# Patient Record
Sex: Female | Born: 1960 | Race: White | Hispanic: No | Marital: Married
Health system: Southern US, Community
[De-identification: ages and names within clinical notes are randomized; demographics above are authoritative.]

---

## 2010-02-12 ENCOUNTER — Ambulatory Visit: Payer: Self-pay | Admitting: Internal Medicine

## 2010-02-25 ENCOUNTER — Ambulatory Visit: Payer: Self-pay | Admitting: Family Medicine

## 2010-03-04 ENCOUNTER — Ambulatory Visit: Payer: Self-pay | Admitting: Internal Medicine

## 2010-03-05 ENCOUNTER — Ambulatory Visit: Payer: Self-pay | Admitting: Internal Medicine

## 2010-03-05 LAB — CANCER ANTIGEN 27.29: CA 27.29: 24.8 U/mL

## 2010-03-15 ENCOUNTER — Ambulatory Visit: Payer: Self-pay | Admitting: Internal Medicine

## 2010-03-22 ENCOUNTER — Ambulatory Visit: Payer: Self-pay | Admitting: Surgery

## 2010-04-15 ENCOUNTER — Ambulatory Visit: Payer: Self-pay | Admitting: Internal Medicine

## 2010-05-15 ENCOUNTER — Ambulatory Visit: Payer: Self-pay | Admitting: Internal Medicine

## 2010-05-17 ENCOUNTER — Ambulatory Visit: Payer: Self-pay | Admitting: Surgery

## 2010-05-24 ENCOUNTER — Ambulatory Visit: Payer: Self-pay | Admitting: Surgery

## 2010-05-31 LAB — PATHOLOGY REPORT

## 2010-06-15 ENCOUNTER — Ambulatory Visit: Payer: Self-pay | Admitting: Internal Medicine

## 2010-07-15 ENCOUNTER — Ambulatory Visit: Payer: Self-pay | Admitting: Internal Medicine

## 2010-08-15 ENCOUNTER — Ambulatory Visit: Payer: Self-pay | Admitting: Internal Medicine

## 2010-09-15 ENCOUNTER — Ambulatory Visit: Payer: Self-pay | Admitting: Internal Medicine

## 2010-10-14 ENCOUNTER — Ambulatory Visit: Payer: Self-pay | Admitting: Internal Medicine

## 2010-11-14 ENCOUNTER — Ambulatory Visit: Payer: Self-pay | Admitting: Internal Medicine

## 2010-12-14 ENCOUNTER — Ambulatory Visit: Payer: Self-pay | Admitting: Internal Medicine

## 2011-01-14 ENCOUNTER — Ambulatory Visit: Payer: Self-pay | Admitting: Internal Medicine

## 2011-02-13 ENCOUNTER — Ambulatory Visit: Payer: Self-pay | Admitting: Internal Medicine

## 2011-03-16 ENCOUNTER — Ambulatory Visit: Payer: Self-pay | Admitting: Internal Medicine

## 2011-04-16 ENCOUNTER — Ambulatory Visit: Payer: Self-pay | Admitting: Internal Medicine

## 2011-05-16 ENCOUNTER — Ambulatory Visit: Payer: Self-pay | Admitting: Internal Medicine

## 2011-06-16 ENCOUNTER — Ambulatory Visit: Payer: Self-pay | Admitting: Internal Medicine

## 2011-07-16 ENCOUNTER — Ambulatory Visit: Payer: Self-pay | Admitting: Internal Medicine

## 2011-07-19 ENCOUNTER — Ambulatory Visit: Payer: Self-pay | Admitting: Surgery

## 2011-08-16 ENCOUNTER — Ambulatory Visit: Payer: Self-pay | Admitting: Internal Medicine

## 2011-09-13 ENCOUNTER — Ambulatory Visit: Payer: Self-pay

## 2011-09-13 LAB — CBC
HCT: 38.9 % (ref 35.0–47.0)
HGB: 13.3 g/dL (ref 12.0–16.0)
MCH: 31.7 pg (ref 26.0–34.0)
MCHC: 34.1 g/dL (ref 32.0–36.0)
MCV: 93 fL (ref 80–100)
Platelet: 211 10*3/uL (ref 150–440)
RDW: 12.7 % (ref 11.5–14.5)
WBC: 4.8 10*3/uL (ref 3.6–11.0)

## 2011-09-13 LAB — BASIC METABOLIC PANEL
Anion Gap: 8 (ref 7–16)
Calcium, Total: 8.8 mg/dL (ref 8.5–10.1)
Chloride: 108 mmol/L — ABNORMAL HIGH (ref 98–107)
Co2: 28 mmol/L (ref 21–32)
EGFR (Non-African Amer.): >60
Osmolality: 286 (ref 275–301)

## 2011-09-16 ENCOUNTER — Ambulatory Visit: Payer: Self-pay | Admitting: Internal Medicine

## 2011-10-14 ENCOUNTER — Ambulatory Visit: Payer: Self-pay | Admitting: Internal Medicine

## 2011-11-03 LAB — CBC CANCER CENTER
Basophil %: 0.9 %
Eosinophil #: 0 x10 3/mm (ref 0.0–0.7)
HCT: 40.5 % (ref 35.0–47.0)
HGB: 13.6 g/dL (ref 12.0–16.0)
Lymphocyte #: 1.1 x10 3/mm (ref 1.0–3.6)
Lymphocyte %: 29.5 %
MCHC: 33.6 g/dL (ref 32.0–36.0)
MCV: 93.9 fL (ref 80–100)
Neutrophil #: 2.4 x10 3/mm (ref 1.4–6.5)
RBC: 4.31 10*6/uL (ref 3.80–5.20)
RDW: 12.1 % (ref 11.5–14.5)

## 2011-11-03 LAB — BASIC METABOLIC PANEL
BUN: 15 mg/dL (ref 7–18)
Chloride: 104 mmol/L (ref 98–107)
Co2: 30 mmol/L (ref 21–32)
Creatinine: 0.79 mg/dL (ref 0.60–1.30)
EGFR (Non-African Amer.): >60
Glucose: 91 mg/dL (ref 65–99)
Osmolality: 284 (ref 275–301)
Potassium: 4 mmol/L (ref 3.5–5.1)
Sodium: 142 mmol/L (ref 136–145)

## 2011-11-03 LAB — HEPATIC FUNCTION PANEL A (ARMC)
Albumin: 3.8 g/dL (ref 3.4–5.0)
Alkaline Phosphatase: 84 U/L (ref 50–136)
SGOT(AST): 23 U/L (ref 15–37)
SGPT (ALT): 28 U/L
Total Protein: 7.1 g/dL (ref 6.4–8.2)

## 2011-11-14 ENCOUNTER — Ambulatory Visit: Payer: Self-pay | Admitting: Internal Medicine

## 2011-11-15 ENCOUNTER — Inpatient Hospital Stay: Payer: Self-pay | Admitting: Surgery

## 2011-11-16 LAB — BASIC METABOLIC PANEL
Calcium, Total: 8.2 mg/dL — ABNORMAL LOW (ref 8.5–10.1)
Chloride: 110 mmol/L — ABNORMAL HIGH (ref 98–107)
Co2: 27 mmol/L (ref 21–32)
EGFR (Non-African Amer.): >60
Osmolality: 289 (ref 275–301)
Potassium: 3.7 mmol/L (ref 3.5–5.1)
Sodium: 146 mmol/L — ABNORMAL HIGH (ref 136–145)

## 2011-12-14 ENCOUNTER — Ambulatory Visit: Payer: Self-pay | Admitting: Internal Medicine

## 2012-01-14 ENCOUNTER — Ambulatory Visit: Payer: Self-pay | Admitting: Internal Medicine

## 2012-01-19 ENCOUNTER — Ambulatory Visit: Payer: Self-pay

## 2012-02-13 ENCOUNTER — Ambulatory Visit: Payer: Self-pay | Admitting: Internal Medicine

## 2012-02-23 LAB — CBC CANCER CENTER
Basophil %: 0.6 %
Eosinophil %: 2.3 %
Eosinophil: 3 %
HCT: 37.6 % (ref 35.0–47.0)
HGB: 12.9 g/dL (ref 12.0–16.0)
Lymphocyte #: 1.4 x10 3/mm (ref 1.0–3.6)
Lymphocyte %: 45.1 %
MCH: 32 pg (ref 26.0–34.0)
MCV: 93 fL (ref 80–100)
Monocyte #: 0.4 x10 3/mm (ref 0.2–0.9)
Monocyte %: 12 %
Platelet: 99 x10 3/mm — ABNORMAL LOW (ref 150–440)
RBC: 4.05 10*6/uL (ref 3.80–5.20)
Segmented Neutrophils: 37 %
WBC: 3 x10 3/mm — ABNORMAL LOW (ref 3.6–11.0)

## 2012-02-23 LAB — BASIC METABOLIC PANEL
Anion Gap: 8 (ref 7–16)
BUN: 10 mg/dL (ref 7–18)
Calcium, Total: 9.1 mg/dL (ref 8.5–10.1)
Chloride: 104 mmol/L (ref 98–107)
Co2: 29 mmol/L (ref 21–32)
Creatinine: 0.78 mg/dL (ref 0.60–1.30)
Osmolality: 280 (ref 275–301)
Sodium: 141 mmol/L (ref 136–145)

## 2012-02-23 LAB — FIBRINOGEN: Fibrinogen: 377 mg/dL (ref 210–470)

## 2012-02-23 LAB — IRON AND TIBC
Iron Bind.Cap.(Total): 340 ug/dL (ref 250–450)
Iron: 167 ug/dL (ref 50–170)
Unbound Iron-Bind.Cap.: 173 ug/dL

## 2012-02-23 LAB — HEPATIC FUNCTION PANEL A (ARMC)
Alkaline Phosphatase: 78 U/L (ref 50–136)
Bilirubin, Direct: 0.1 mg/dL (ref 0.00–0.20)
Bilirubin,Total: 0.5 mg/dL (ref 0.2–1.0)
SGOT(AST): 49 U/L — ABNORMAL HIGH (ref 15–37)

## 2012-02-23 LAB — PROTIME-INR: Prothrombin Time: 12.4 secs (ref 11.5–14.7)

## 2012-02-23 LAB — RETICULOCYTES
Absolute Retic Count: 0.0416 10*6/uL (ref 0.024–0.084)
Reticulocyte: 1.02 % (ref 0.5–1.5)

## 2012-02-23 LAB — SEDIMENTATION RATE: Erythrocyte Sed Rate: 14 mm/hr (ref 0–30)

## 2012-02-24 LAB — URINE IEP, RANDOM

## 2012-02-27 LAB — PROT IMMUNOELECTROPHORES(ARMC)

## 2012-03-08 LAB — CBC CANCER CENTER
Basophil #: 0 x10 3/mm (ref 0.0–0.1)
Basophil %: 1 %
Eosinophil #: 0 x10 3/mm (ref 0.0–0.7)
Lymphocyte %: 50.1 %
MCH: 31.5 pg (ref 26.0–34.0)
MCHC: 33.8 g/dL (ref 32.0–36.0)
MCV: 93 fL (ref 80–100)
Monocyte #: 0.3 x10 3/mm (ref 0.2–0.9)
Monocyte %: 18.1 %
Neutrophil #: 0.5 x10 3/mm — ABNORMAL LOW (ref 1.4–6.5)
Neutrophil %: 29.9 %
RDW: 13.4 % (ref 11.5–14.5)
WBC: 1.8 x10 3/mm — CL (ref 3.6–11.0)

## 2012-03-13 LAB — CBC CANCER CENTER
Eosinophil: 2 %
HGB: 11.7 g/dL — ABNORMAL LOW (ref 12.0–16.0)
Lymphocytes: 56 %
MCHC: 34.2 g/dL (ref 32.0–36.0)
MCV: 94 fL (ref 80–100)
Other Cells Blood: 2 %
Platelet: 95 x10 3/mm — ABNORMAL LOW (ref 150–440)
RDW: 13.8 % (ref 11.5–14.5)
Segmented Neutrophils: 23 %
WBC: 1.9 x10 3/mm — CL (ref 3.6–11.0)

## 2012-03-13 LAB — RETICULOCYTES
Absolute Retic Count: 0.0483 10*6/uL (ref 0.023–0.096)
Reticulocyte: 1.33 % (ref 0.5–2.2)

## 2012-03-15 ENCOUNTER — Ambulatory Visit: Payer: Self-pay | Admitting: Internal Medicine

## 2012-04-15 ENCOUNTER — Ambulatory Visit: Payer: Self-pay | Admitting: Internal Medicine

## 2012-05-11 LAB — CBC CANCER CENTER
Basophil %: 1.4 %
Eosinophil #: 0 x10 3/mm (ref 0.0–0.7)
Eosinophil %: 4.3 %
HGB: 10 g/dL — ABNORMAL LOW (ref 12.0–16.0)
Lymphocyte #: 0.6 x10 3/mm — ABNORMAL LOW (ref 1.0–3.6)
Lymphocyte %: 67.4 %
MCH: 32.3 pg (ref 26.0–34.0)
MCHC: 34.8 g/dL (ref 32.0–36.0)
Monocyte #: 0 x10 3/mm — ABNORMAL LOW (ref 0.2–0.9)
Neutrophil %: 25.7 %
RBC: 3.1 10*6/uL — ABNORMAL LOW (ref 3.80–5.20)
RDW: 17.9 % — ABNORMAL HIGH (ref 11.5–14.5)

## 2012-05-14 LAB — CBC CANCER CENTER
Basophil %: 0.1 %
Eosinophil %: 2.2 %
HGB: 9.5 g/dL — ABNORMAL LOW (ref 12.0–16.0)
Lymphocyte #: 0.8 x10 3/mm — ABNORMAL LOW (ref 1.0–3.6)
Lymphocyte %: 70.8 %
MCH: 32.5 pg (ref 26.0–34.0)
Monocyte #: 0.3 x10 3/mm (ref 0.2–0.9)
Neutrophil %: 3.9 %
Platelet: 12 x10 3/mm — CL (ref 150–440)
RBC: 2.93 10*6/uL — ABNORMAL LOW (ref 3.80–5.20)
WBC: 1.1 x10 3/mm — CL (ref 3.6–11.0)

## 2012-05-15 ENCOUNTER — Ambulatory Visit: Payer: Self-pay | Admitting: Internal Medicine

## 2012-05-16 LAB — CBC CANCER CENTER
Basophil %: 0.4 %
Eosinophil %: 0.5 %
HGB: 8.8 g/dL — ABNORMAL LOW (ref 12.0–16.0)
Lymphocyte #: 2.5 x10 3/mm (ref 1.0–3.6)
MCH: 31.7 pg (ref 26.0–34.0)
MCV: 94 fL (ref 80–100)
Monocyte #: 6.2 x10 3/mm — ABNORMAL HIGH (ref 0.2–0.9)
RBC: 2.76 10*6/uL — ABNORMAL LOW (ref 3.80–5.20)

## 2012-05-18 LAB — CBC CANCER CENTER
Comment - H1-Com2: NORMAL
HGB: 8.7 g/dL — ABNORMAL LOW (ref 12.0–16.0)
MCH: 31.8 pg (ref 26.0–34.0)
MCV: 93 fL (ref 80–100)
Metamyelocyte: 6 %
Monocytes: 15 %
Platelet: 49 x10 3/mm — ABNORMAL LOW (ref 150–440)
RBC: 2.74 10*6/uL — ABNORMAL LOW (ref 3.80–5.20)
WBC: 30.7 x10 3/mm — ABNORMAL HIGH (ref 3.6–11.0)

## 2012-05-21 LAB — CBC CANCER CENTER
Basophil #: 0.1 x10 3/mm (ref 0.0–0.1)
Basophil %: 0.7 %
Eosinophil %: 0.5 %
HCT: 25.6 % — ABNORMAL LOW (ref 35.0–47.0)
HGB: 8.8 g/dL — ABNORMAL LOW (ref 12.0–16.0)
Lymphocyte #: 1.8 x10 3/mm (ref 1.0–3.6)
Lymphocyte %: 14.8 %
MCH: 32.2 pg (ref 26.0–34.0)
MCV: 94 fL (ref 80–100)
Monocyte %: 13.1 %
Neutrophil #: 8.6 x10 3/mm — ABNORMAL HIGH (ref 1.4–6.5)
Platelet: 139 x10 3/mm — ABNORMAL LOW (ref 150–440)
RDW: 17.4 % — ABNORMAL HIGH (ref 11.5–14.5)
WBC: 12.1 x10 3/mm — ABNORMAL HIGH (ref 3.6–11.0)

## 2012-05-23 LAB — CBC CANCER CENTER
Basophil %: 0.6 %
Eosinophil #: 0 x10 3/mm (ref 0.0–0.7)
Eosinophil %: 0.8 %
HCT: 25.7 % — ABNORMAL LOW (ref 35.0–47.0)
HGB: 8.8 g/dL — ABNORMAL LOW (ref 12.0–16.0)
Lymphocyte #: 1.1 x10 3/mm (ref 1.0–3.6)
MCHC: 34.4 g/dL (ref 32.0–36.0)
MCV: 95 fL (ref 80–100)
Monocyte #: 0.9 x10 3/mm (ref 0.2–0.9)
Monocyte %: 18.7 %
Platelet: 210 x10 3/mm (ref 150–440)
RBC: 2.71 10*6/uL — ABNORMAL LOW (ref 3.80–5.20)
WBC: 4.6 x10 3/mm (ref 3.6–11.0)

## 2012-05-30 LAB — CBC CANCER CENTER
Basophil #: 0 x10 3/mm (ref 0.0–0.1)
Eosinophil #: 0 x10 3/mm (ref 0.0–0.7)
HGB: 9.4 g/dL — ABNORMAL LOW (ref 12.0–16.0)
Lymphocyte #: 0.6 x10 3/mm — ABNORMAL LOW (ref 1.0–3.6)
Lymphocyte %: 29.9 %
MCH: 34.4 pg — ABNORMAL HIGH (ref 26.0–34.0)
MCHC: 34.7 g/dL (ref 32.0–36.0)
Monocyte #: 0.4 x10 3/mm (ref 0.2–0.9)
Monocyte %: 18.1 %
Neutrophil %: 50.6 %
Platelet: 170 x10 3/mm (ref 150–440)
RDW: 22.8 % — ABNORMAL HIGH (ref 11.5–14.5)
WBC: 2 x10 3/mm — CL (ref 3.6–11.0)

## 2012-05-30 LAB — HEPATIC FUNCTION PANEL A (ARMC)
Albumin: 3.4 g/dL (ref 3.4–5.0)
SGOT(AST): 20 U/L (ref 15–37)
SGPT (ALT): 29 U/L (ref 12–78)

## 2012-05-30 LAB — CREATININE, SERUM
EGFR (African American): >60
EGFR (Non-African Amer.): >60

## 2012-05-31 LAB — CANCER ANTIGEN 27.29: CA 27.29: 38.9 U/mL — ABNORMAL HIGH (ref 0.0–38.6)

## 2012-06-15 ENCOUNTER — Ambulatory Visit: Payer: Self-pay | Admitting: Internal Medicine

## 2012-08-30 IMAGING — US ABDOMEN ULTRASOUND
1 series · 14 of 25 positions shown · non-contrast
Comparison: none

REASON FOR EXAM: thrombocytopenia eval hepatosplenomegaly
COMMENTS:

PROCEDURE:     TIGER - TIGER ABDOMEN GENERAL SURVEY  - February 28, 2012  [DATE]
RESULT:     Comparison: None.
TECHNIQUE: Multiple grayscale and color Doppler images were obtained of the
abdomen.

[Series 1: abdomen ultrasound · 0.21mm/px · 14 of 67 slices shown]
[im 1/67]
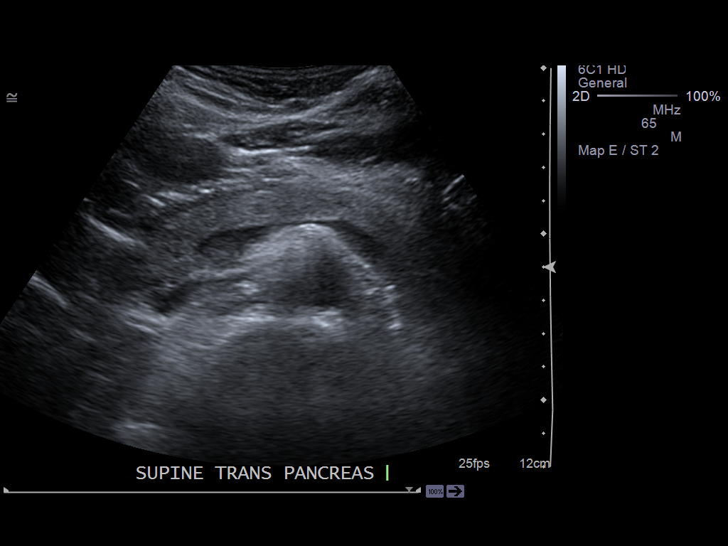
[im 6/67]
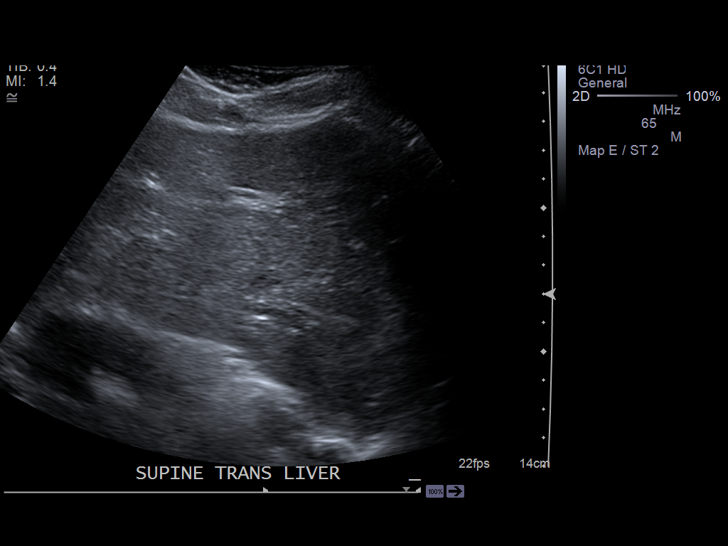
[im 12/67]
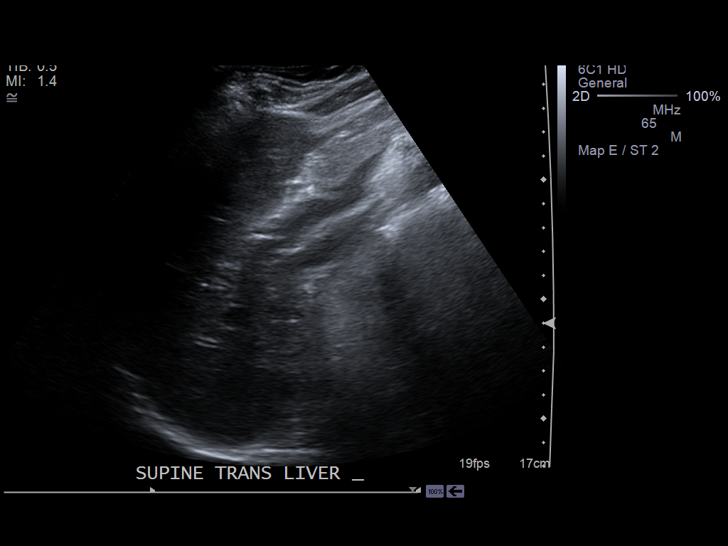
[im 17/67]
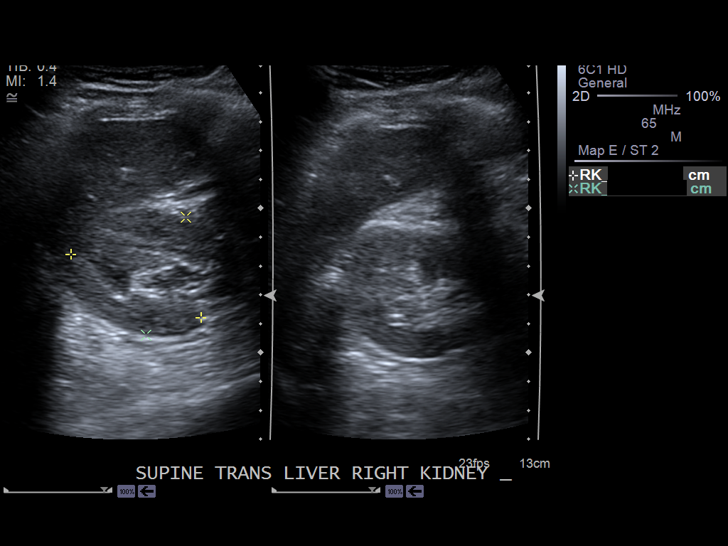
[im 23/67]
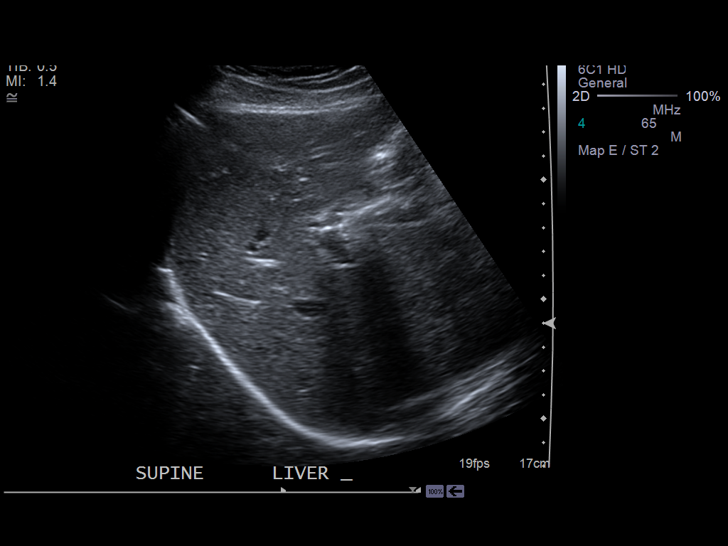
[im 25/67]
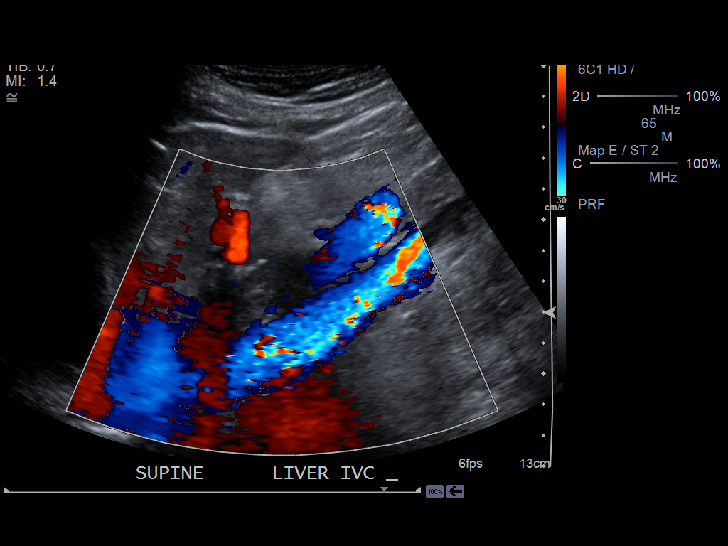
[im 31/67]
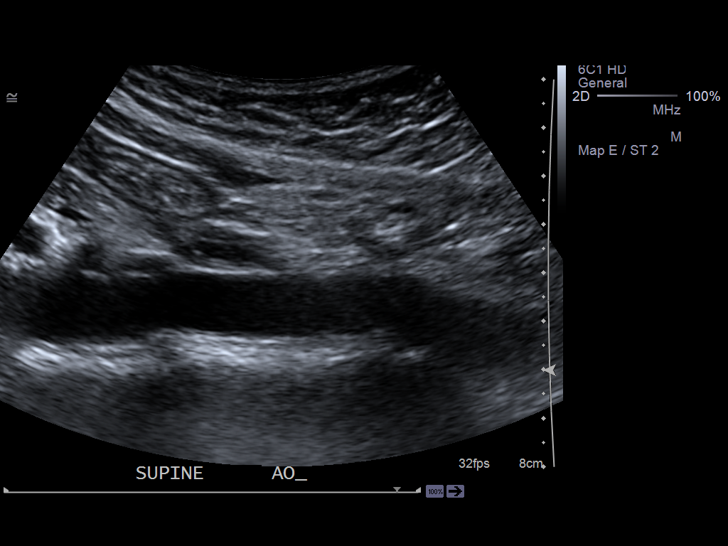
[im 36/67]
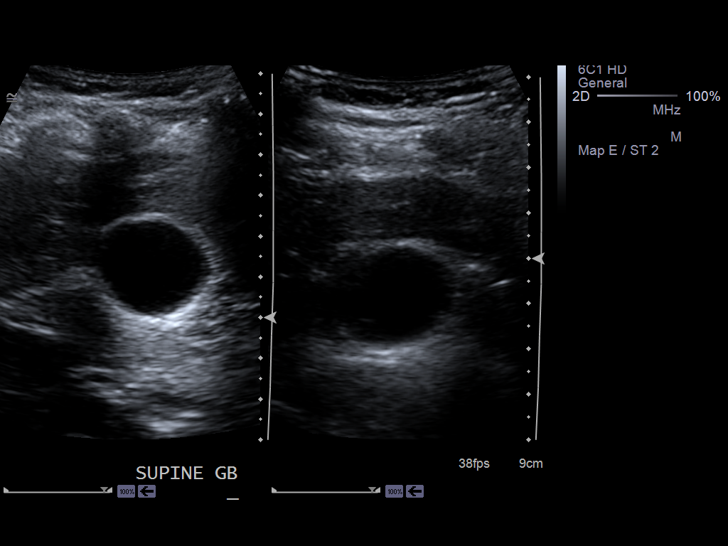
[im 42/67]
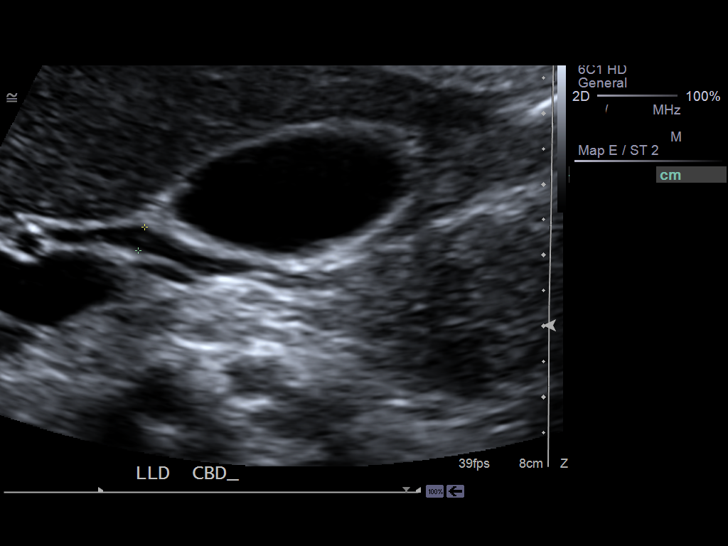
[im 45/67]
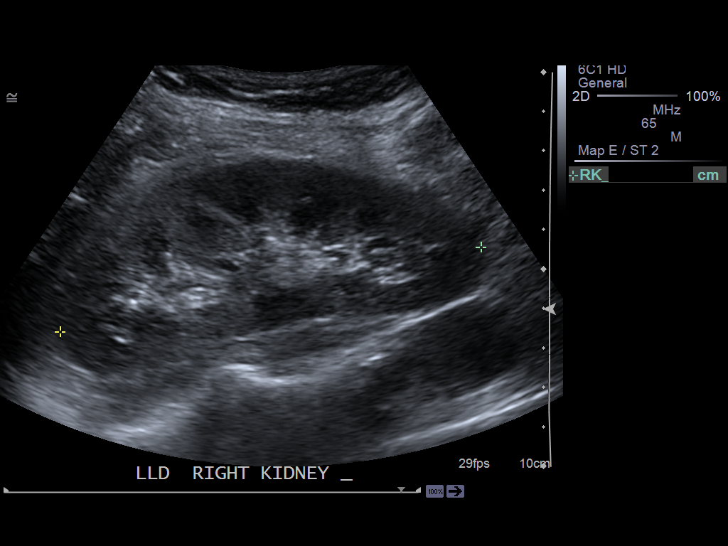
[im 50/67]
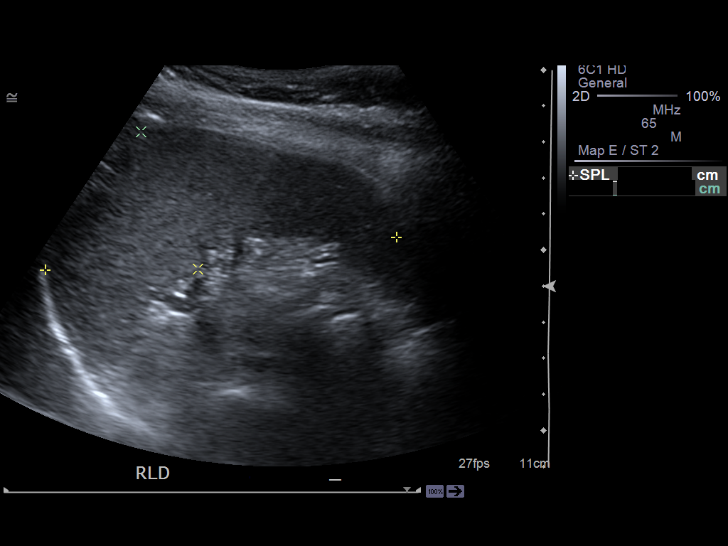
[im 56/67]
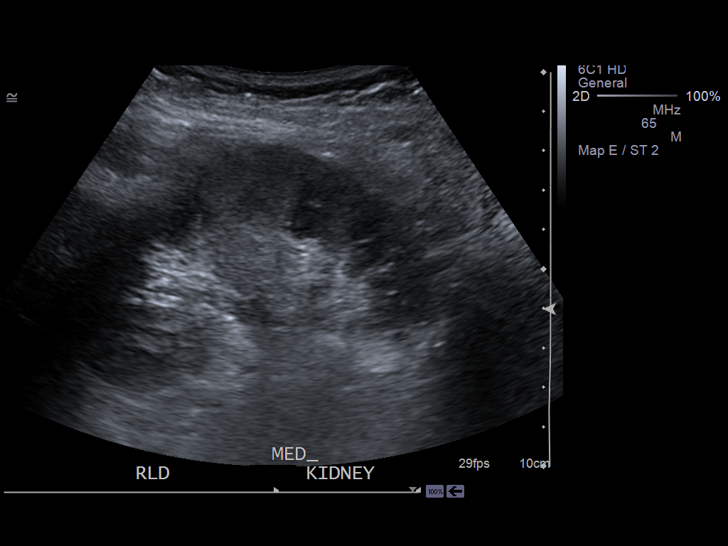
[im 61/67]
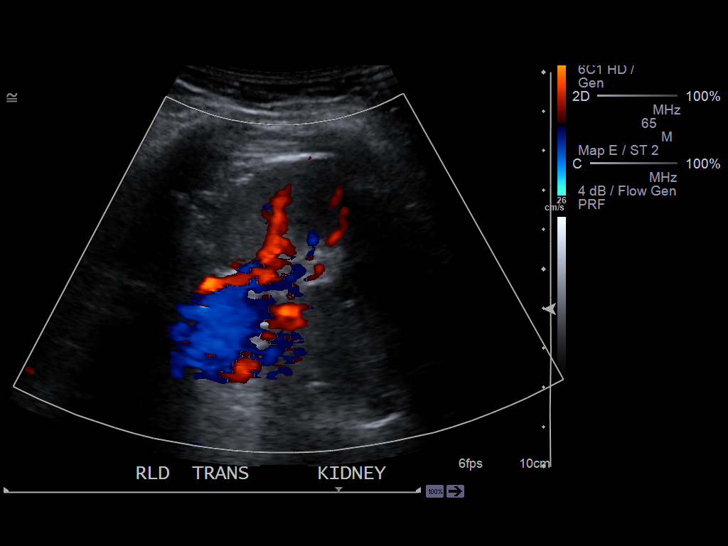
[im 67/67]
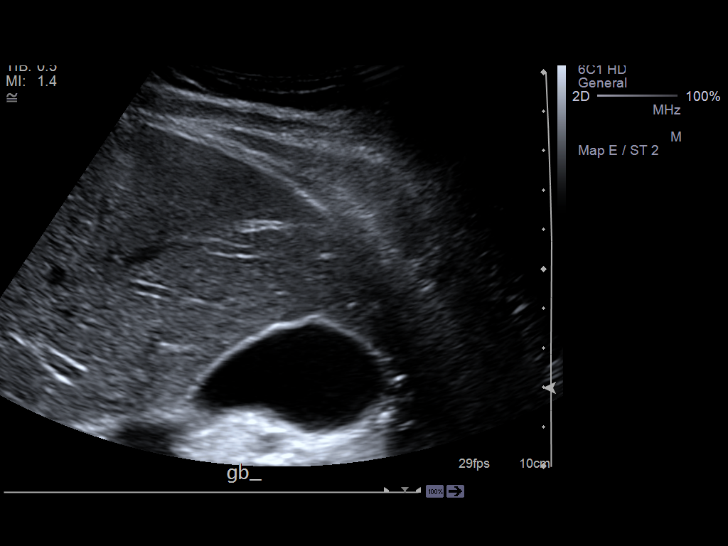

[14 of 25 positions shown; findings below may reference images not displayed]

FINDINGS: The visualized liver, pancreas, and spleen are unremarkable. The spleen
measures 9.9 cm in greatest dimension. The gallbladder is normal.
Sonographic Murphy sign was negative. The common bile duct measures 4 mm.

Images of the kidneys showed no hydronephrosis.
IMPRESSION: Unremarkable abdominal ultrasound. The spleen is normal in size.

[REDACTED]

## 2012-10-30 ENCOUNTER — Ambulatory Visit: Payer: Self-pay | Admitting: Internal Medicine

## 2014-12-07 NOTE — Op Note (Signed)
PATIENT NAME:  Nicole Cooley, Nicole Cooley MR#:  [id number] DATE OF BIRTH:  1961/07/22  DATE OF PROCEDURE:  11/15/2011  PREOPERATIVE DIAGNOSIS: Personal history of breast cancer status post left modified radical mastectomy and radiation therapy to the chest.   POSTOPERATIVE DIAGNOSIS: Personal history of breast cancer status post left modified radical mastectomy and radiation therapy to the chest.   PROCEDURE: Left delayed breast reconstruction with latissimus dorsi musculocutaneous flap extended with subcutaneous fat.   SURGEON:  Caryl Asp, M.D.   ANESTHESIA: General endotracheal.   ESTIMATED BLOOD LOSS: Less than 50 mL.   COMPLICATIONS: None.   DRAINS: 15 [place] Blake drains time two in the back and 15 [place] Blake drain times one in the chest.   FINDINGS: Excellent tissue perfusion and hemostasis at the completion of the procedure with good breast shape.   STATEMENT OF NECESSITY: Nicole Cooley is a very pleasant 54 year old female with a history of left-sided breast cancer. She has undergone modified radical mastectomy and delayed breast reconstruction until this point. She has a small breast on the right contralateral side and is interested in reconstruction with a possible augmentation to both sides. The tissue envelope is quite fibrotic secondary to radiation therapy and as such we have discussed the need for latissimus dorsi flap. The risks, benefits, and alternatives including tissue expansion and TRAM flap reconstruction have been discussed with her thoroughly and she has requested the procedure as described.   STATEMENT OF PROCEDURE: The patient was brought to the operating room awake, alert and comfortable and placed on the OR table in the supine position. Endotracheal anesthesia was introduced without apparent complication. A Foley catheter was inserted and the patient was transferred to the right lateral decubitus position with an axillary roll and beanbag underneath her. Bony prominences were  padded. SCDs were on and functioning and the patient's chest was prepped and draped in the usual sterile fashion. The preoperative markings were utilized. An elliptical skin paddle was marked out posteriorly approximately 14 cm in length and 8 cm in height. This was made sharply through the skin, and then using electrocautery the skin paddle was beveled down through Scarpa's, and then the plane underneath Scarpa's fascia, allowing the subcutaneous excess fat to remain with the latissimus flap, was dissected in all directions. Using a lighted retractor, hemostasis was proactively maintained throughout the dissection. At the limits of the dissection posteriorly the trapezius muscle was elevated and the superior edge of the latissimus was identified. This was disconnected from the midline and the latissimus was swept away from the deeper layers, including the fat overlying the serratus and the underlying lumbar fascia. Intercostal perforators were identified and clipped 2 times on each side and then cauterized and divided. The inferior edge of the flap was also divided under direct visualization and the flap was then rolled up and anteriorly, continuing the dissection over the serratus, being careful not to lift up this musculature. Dissection continued up towards the axilla and at the completion of the procedure the flap was fully mobile. Hemostasis was confirmed. The wound was copiously irrigated and attention was directed towards the chest and the previous incision was opened and flaps developed superiorly and inferiorly directly along the pectoralis musculature. Minimal bleeding was encountered. A tunnel was created close to the axilla in the superior portion of the previous dissection and the latissimus and flap were easily passed through this tunnel. Two 15 [place] Blake drains were brought out through the flank and secured in place and the entire wound  was sprayed with Tisseel and then closed with minimal tension  using deep 0 PDS suture in Scarpa's layer followed by 3-0 Vicryl in the dermis and 4-0 subcuticular stitch. The wound was dressed with Dermabond and Steri-Strips and attention was directed towards the chest. The flap was carefully laid inside the chest pocket and stapled in place and the wound was covered with Ioban. Sterile dressing was applied to the back and then the patient was rolled to the supine position. The bean bag was removed and bony prominences were re-padded. Both arms were placed in the outstretched position and the patient was then reprepped and draped in the usual sterile fashion.   Further mobilization of the flap was then performed. Dissecting superior to the pectoralis the area was quite fibrotic, and inferior to the pectoralis in the area of the axilla and the thoracodorsal vessels significant fibrosis was encountered secondary to radiation. Ultimately soft tissues were identified but superior and anterior to the pedicle careful bipolar and sharp dissection was performed. The serratus branch and the thoracodorsal branch were identified and preserved. The motor nerve to the latissimus was also identified. There were actually two branches. Both were identified and the nerve was divided just above the bifurcation and separated using Ligaclips. The vessels were preserved and the flap remained 100% viable.   Next, the pedicle was reflected inferiorly and posteriorly and this allowed division of the origin at the humerus. 95% of this was taken down allowing the flap to extend up onto the chest. The lateralmost portion on the back swung to the anterior chest and rotated superior and lateral on the front of the chest. This allowed the inferiormost portion of the flap, which was previously over the lumbar area of the back, to lie on top of the pectoralis musculature underneath the superior skin flap. A small amount of the latissimus was then rolled underneath the inframammary fold. 2-0 Vicryl was  used to inset the Scarpa's layer and fat down into the lower portion of the new breast mound. This gave a fullness to the lower pole. A dart approximately 3 cm in length and perpendicular to the transverse mastectomy scar was made in the lower breast flap and this allowed the skin paddle to inset. Triangles were removed along the inferior breast flap allowing the new latissimus skin to inset nicely in the lower third of the breast. Some of the previous lower breast mastectomy flap was allowed to remain. A small, approximately 3-cm portion of the distal flap was removed. The musculature bled nicely and was cauterized and then was inset with 2-0 and 3-0 Vicryl in the upper medial portion of the chest. This gave a nice cushion of healthy muscle between the pectoralis and the upper mastectomy flap. The skin flap was then inset with 3-0 Vicryl and 4-0 Monocryl. The wound was dressed with Dermabond. Needle, sponge, and lap counts were correct. A 15 [place] Blake drain was placed beneath both the pectoralis and the latissimus and brought out through a stab incision prior to closure and also went towards the axilla. She tolerated the procedure well. She was transferred in good condition, extubated, to recovery.    ____________________________ Shellee MiloBrian S. Ariel Wingrove, MD [id number] D: 11/16/2011 20:21:39 ET T: 11/17/2011 09:26:33 ET JOB#: [id number]  cc: Shellee MiloBrian S. Jillyan Plitt, MD, <Dictator> Shellee MiloBRIAN S Norma Ignasiak MD ELECTRONICALLY SIGNED 12/12/2011 18:19
